# Patient Record
Sex: Female | Born: 1935 | Race: White
Health system: Southern US, Community
[De-identification: ages and names within clinical notes are randomized; demographics above are authoritative.]

---

## 2004-11-18 ENCOUNTER — Other Ambulatory Visit: Payer: Self-pay

## 2004-11-18 ENCOUNTER — Inpatient Hospital Stay: Payer: Self-pay | Admitting: Surgery

## 2006-06-20 IMAGING — US ABDOMEN ULTRASOUND
1 series · 17 of 25 positions shown · non-contrast
Comparison: none

REASON FOR EXAM: Cholelithiasis
COMMENTS:

[Series 1: abdomen ultrasound · 17 of 71 slices shown]
[im 1/71]
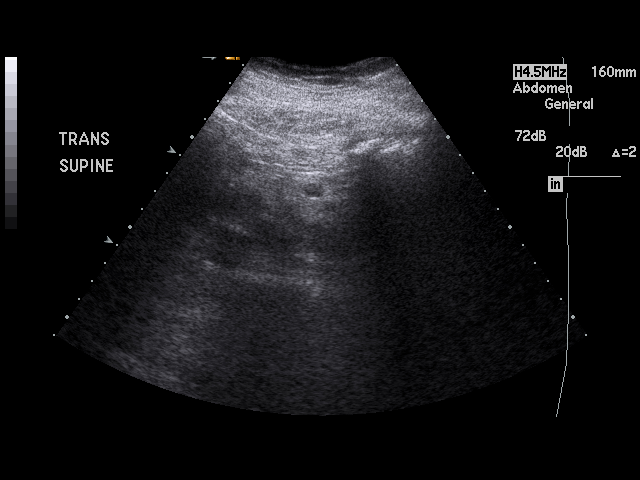
[im 6/71]
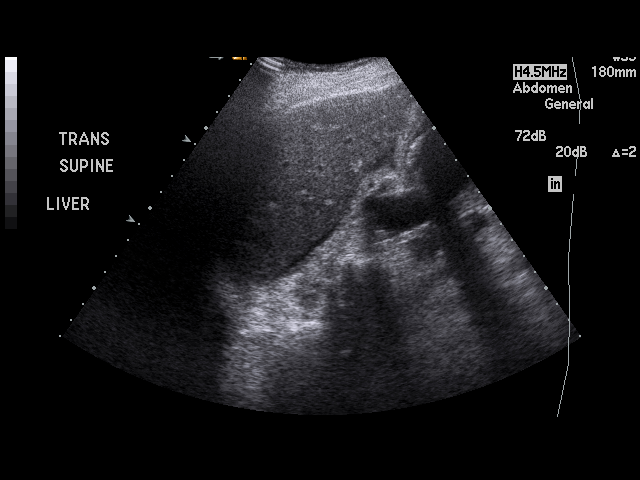
[im 9/71]
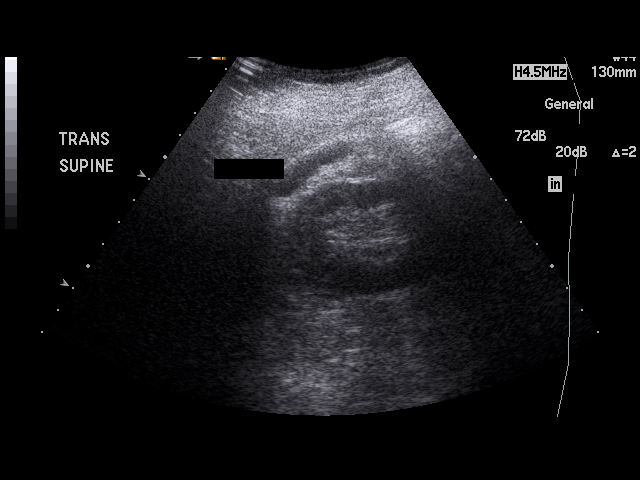
[im 15/71]
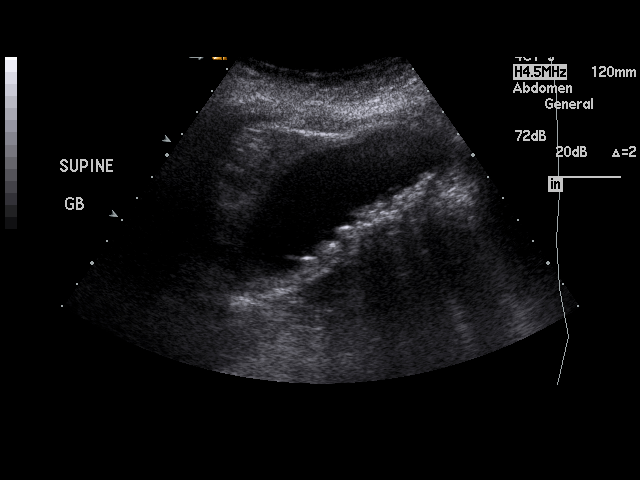
[im 18/71]
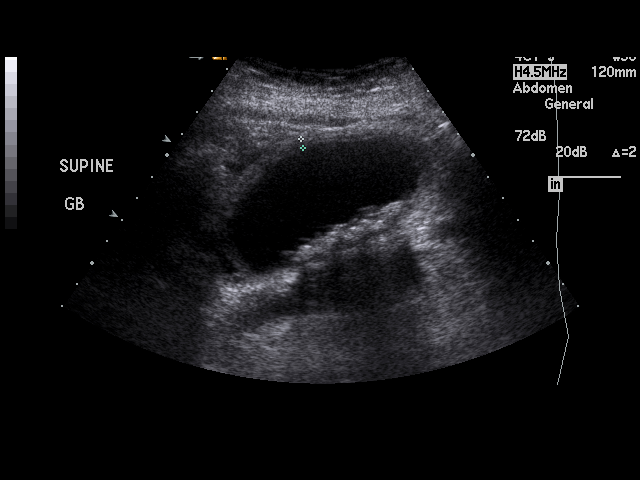
[im 24/71]
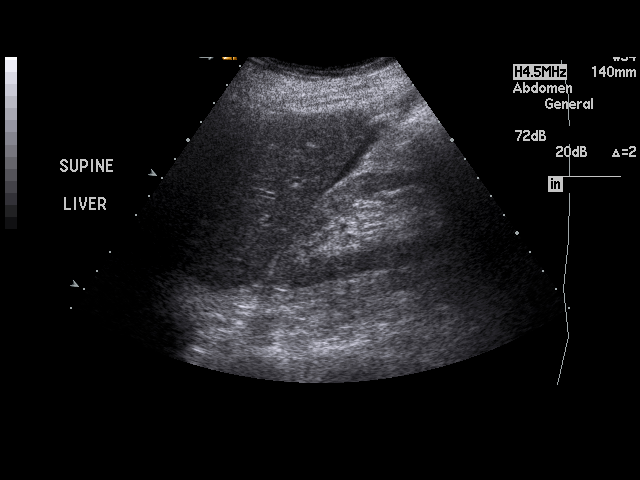
[im 27/71]
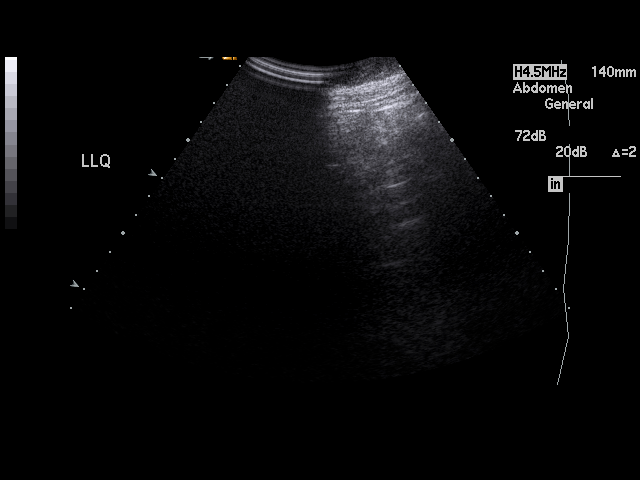
[im 33/71]
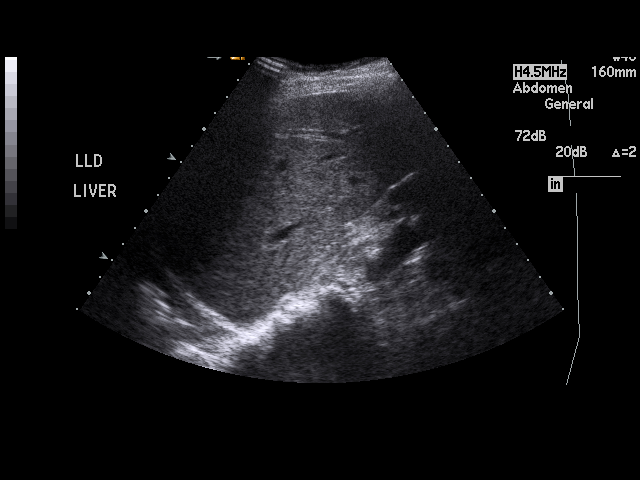
[im 36/71]
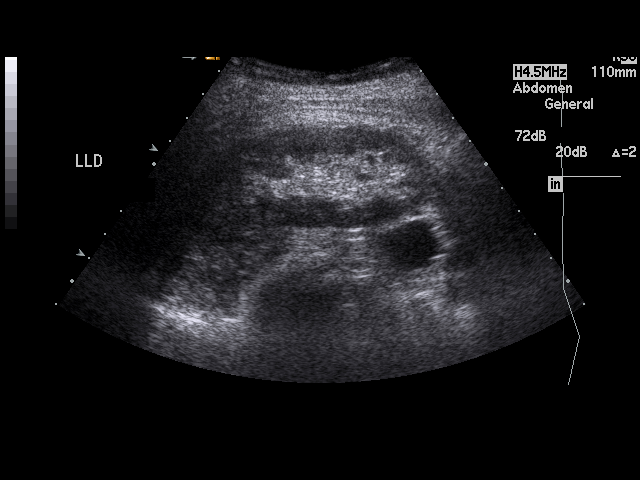
[im 38/71]
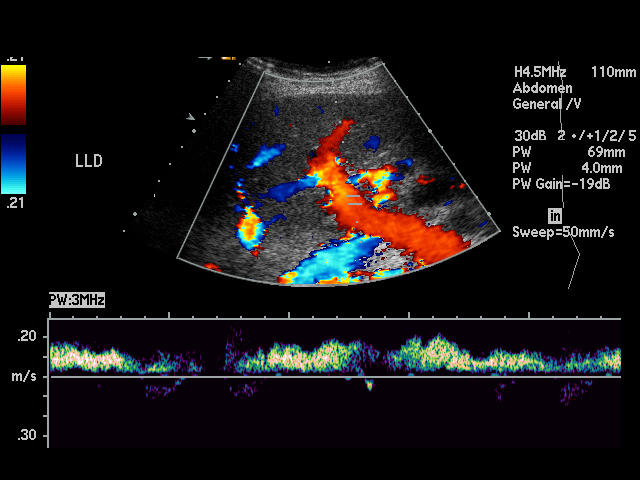
[im 44/71]
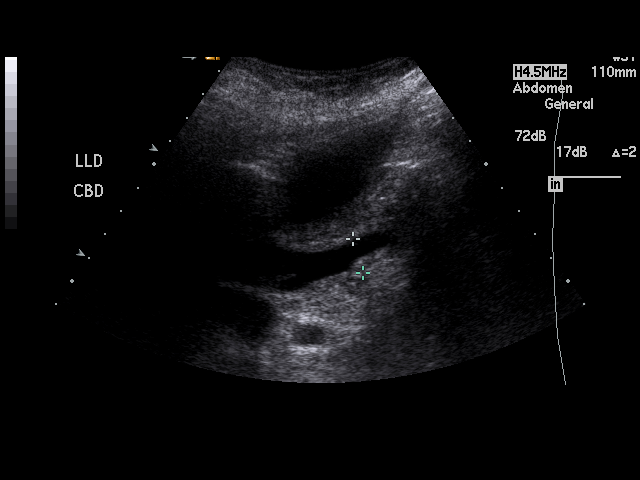
[im 47/71]
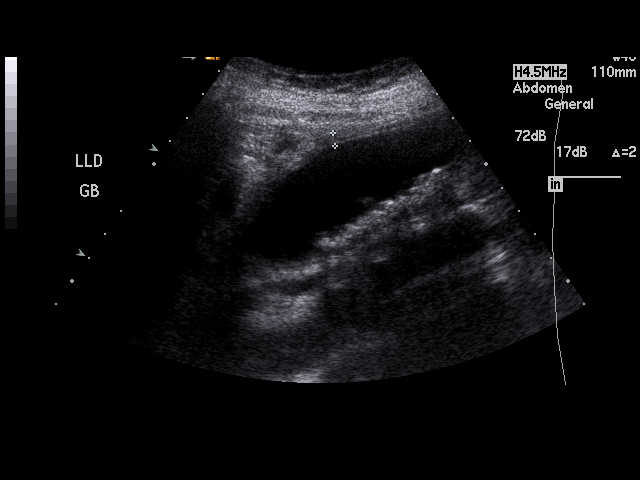
[im 53/71]
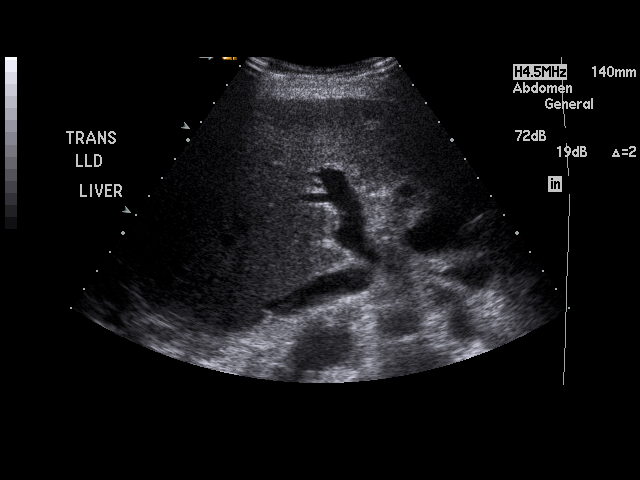
[im 56/71]
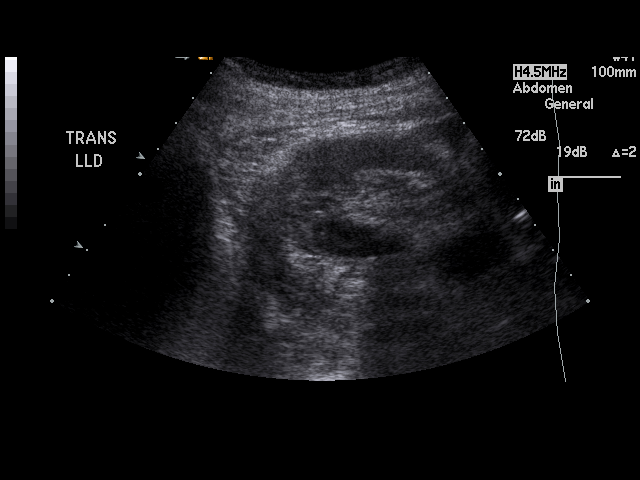
[im 62/71]
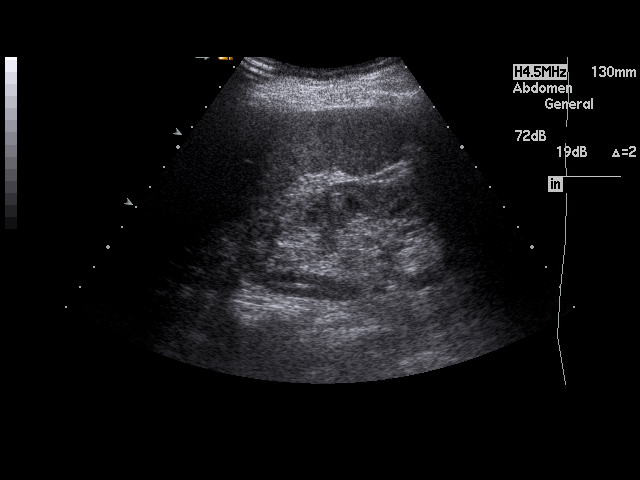
[im 65/71]
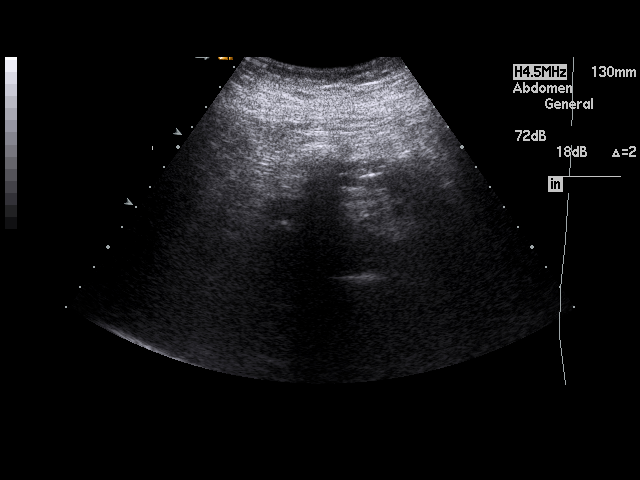
[im 71/71]
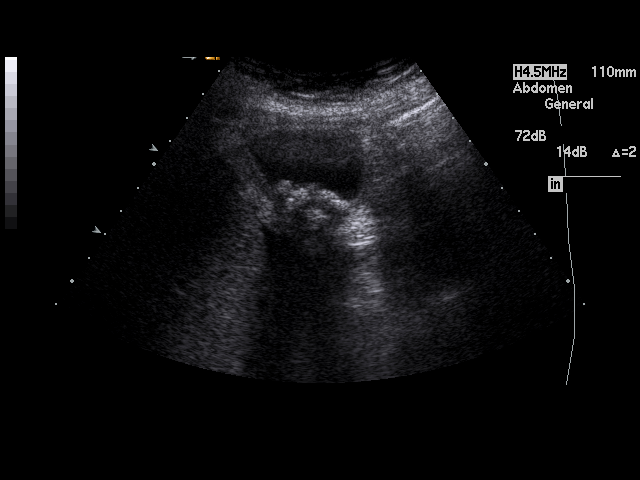

[17 of 25 positions shown; findings below may reference images not displayed]

PROCEDURE:     US  - US ABDOMEN GENERAL SURVEY  - November 19, 2004  [DATE]

RESULT:     The patient is complaining of abdominal discomfort.

The gallbladder is adequately distended but exhibits wall thickening.  The
wall measures 5 mm.  There are multiple, layering echogenic stones with
distal shadowing.  The patient was heavily medicated, and a Murphy's sign
could not be clearly elicited, but there is a history of upper abdominal
discomfort.

The common bile duct is dilated and measures as much as 13 mm in greatest
dimension.  There is evidence of a common bile duct stone in the distal
portion of the common duct.

The liver is exhibits normal echotexture.  There is a small amount of
ascites adjacent to the liver.  The pancreas could not be demonstrated due
to the presence of bowel gas.  The spleen and abdominal aorta were normal in
appearance.  The kidneys also are normal with no evidence of obstruction or
calcified stones.
IMPRESSION: 1.     There are gallstones present, and there are common bile duct stones
as well.  The common bile duct is dilated to nearly 13 mm.  The gallbladder
wall is thickened and approximately 5 mm.  There is fluid adjacent to the
liver.  No definite pericholecystic fluid is seen.
2.     The pancreas could not be demonstrated due to the presence of bowel
gas.

Dr. Jnr Gh was paged with this result at [DATE] on 19 November, 2004.

## 2017-11-13 ENCOUNTER — Telehealth: Payer: Self-pay | Admitting: *Deleted

## 2017-11-13 NOTE — Telephone Encounter (Signed)
error
